# Patient Record
Sex: Female | Born: 1995 | Hispanic: Refuse to answer | Marital: Single | Smoking: Never smoker
Health system: Southern US, Community
[De-identification: ages and names within clinical notes are randomized; demographics above are authoritative.]

---

## 2016-02-05 ENCOUNTER — Ambulatory Visit (INDEPENDENT_AMBULATORY_CARE_PROVIDER_SITE_OTHER): Payer: Managed Care, Other (non HMO) | Admitting: Family Medicine

## 2016-02-05 ENCOUNTER — Ambulatory Visit
Admission: RE | Admit: 2016-02-05 | Discharge: 2016-02-05 | Disposition: A | Discharge status: Home / Self Care | Payer: Managed Care, Other (non HMO) | Source: Ambulatory Visit | Attending: Family Medicine | Admitting: Family Medicine

## 2016-02-05 ENCOUNTER — Encounter: Payer: Self-pay | Admitting: Family Medicine

## 2016-02-05 DIAGNOSIS — M545 Low back pain, unspecified: Secondary | ICD-10-CM

## 2016-02-05 MED ORDER — NAPROXEN 500 MG PO TABS: 500.0000 mg | ORAL_TABLET | Freq: Two times a day (BID) | ORAL | 0 refills | Status: AC

## 2016-02-05 MED ORDER — CYCLOBENZAPRINE HCL 5 MG PO TABS: 5.0000 mg | ORAL_TABLET | Freq: Two times a day (BID) | ORAL | 0 refills | Status: AC

## 2016-02-09 ENCOUNTER — Other Ambulatory Visit: Payer: Self-pay | Admitting: Family Medicine

## 2016-02-09 DIAGNOSIS — M545 Low back pain, unspecified: Secondary | ICD-10-CM

## 2016-02-12 ENCOUNTER — Ambulatory Visit (HOSPITAL_COMMUNITY)
Admission: RE | Admit: 2016-02-12 | Discharge: 2016-02-12 | Disposition: A | Discharge status: Home / Self Care | Payer: Managed Care, Other (non HMO) | Source: Ambulatory Visit | Attending: Family Medicine | Admitting: Family Medicine

## 2016-02-12 DIAGNOSIS — M5127 Other intervertebral disc displacement, lumbosacral region: Secondary | ICD-10-CM | POA: Diagnosis not present

## 2016-02-12 DIAGNOSIS — M545 Low back pain, unspecified: Secondary | ICD-10-CM

## 2016-02-14 ENCOUNTER — Ambulatory Visit: Payer: Managed Care, Other (non HMO)

## 2016-03-27 NOTE — Progress Notes (Signed)
Patient presents today with symptoms of lower back pain for the last few months. Patient denies any injury or trauma. Patient denies any radicular symptoms, incontinence, fever/chills, weight loss. She states that flexion and extension causes her symptoms. She has taken some anti-inflammatory medication at times for discomfort. She denies any recent imaging of her back.  ROS: Negative except mentioned above. Vitals as per Epic. GENERAL: NAD HEENT: no pharyngeal erythema, no exudate RESP: CTA B CARD: RRR MSK: No midline tenderness of back, mild generalized lower lumbar tenderness bilaterally, full range of motion, negative straight leg raise, normal toe and heel walk, normal gait, NV intact NEURO: CN II-XII grossly intact   A/P: Lower back pain without radicular symptoms - given the length of time of her symptoms will do some imaging, NSAIDs when necessary, muscle relaxant when necessary, seek medical attention if symptoms persist or worsen as discussed, modify activity as needed, work on cor strengthening and hamstring flexibility. Discussed with trainer.

## 2016-04-01 ENCOUNTER — Ambulatory Visit (INDEPENDENT_AMBULATORY_CARE_PROVIDER_SITE_OTHER): Payer: Managed Care, Other (non HMO) | Admitting: Family Medicine

## 2016-04-01 VITALS — BP 117/74 | HR 66 | Temp 98.3°F

## 2016-04-01 DIAGNOSIS — B349 Viral infection, unspecified: Secondary | ICD-10-CM

## 2016-04-01 NOTE — Progress Notes (Signed)
Patient presents today with symptoms of nasal drainage, cough, mild sore throat, night sweats, subjective fever. Patient states that she's had symptoms for the last day. She denies any chest pain, shortness of breath, abdominal pain, nausea, vomiting, diarrhea. She denies any history of mono in the past.  ROS: Negative except mentioned above.  GENERAL: NAD HEENT: mild pharyngeal erythema, no exudate, no erythema of TMs, mild to moderate cervical LAD RESP: CTA B CARD: RRR ABD: Nontender, no organomegaly appreciated NEURO: CN II-XII grossly intact   A/P: Viral Illness - will treat with Tylenol/Motrin when necessary, Claritin when necessary, Delsym when necessary, rest, hydration, if symptoms worsen would do labs such as Monospot test. No athletic activity today. Seek medical attention if symptoms persist or worsen as discussed. Discussed plan with trainer.

## 2016-04-21 ENCOUNTER — Ambulatory Visit (INDEPENDENT_AMBULATORY_CARE_PROVIDER_SITE_OTHER): Payer: Managed Care, Other (non HMO) | Admitting: Family Medicine

## 2016-04-21 DIAGNOSIS — G8929 Other chronic pain: Secondary | ICD-10-CM

## 2016-04-21 DIAGNOSIS — M545 Low back pain, unspecified: Secondary | ICD-10-CM

## 2016-04-21 NOTE — Progress Notes (Signed)
Patient presents today with persistent symptoms of lower back pain. Patient had an MRI back in February 2018 which showed a small disc protrusion at L5-S1. Patient has been able to modify softball and weight room activity and has been able to participate. She has been working on core strengthening and hamstring flexibility. She denies any incontinence, fever, radicular symptoms. Most of her symptoms are with flexion-type activities. She has taken naproxen and Flexeril in the past for her symptoms. She has not taken any steroids in the past. No new injury or trauma.  ROS: Negative except mentioned above.  GENERAL: NAD RESP: CTA B CARD: RRR MSK: Back - mild midline tenderness in the L5-S1 area, mild paravertebral tenderness in this area bilaterally, increased discomfort with flexion, normal extension, good hamstring flexibility, negative straight leg raise, normal heel and toe walk, NV intact NEURO: CN II-XII grossly intact   A/P: Lower back pain without radicular symptoms - reviewed previous MRI, would suggest trying a 12 day course of tapered steroids, Flexeril when necessary, continue to modify activity. If symptoms do persist or worsen do recommend that patient see Dr. Ardine Eng or Dr. Charisse March for further evaluation and treatment. Patient addresses understanding of plan and will seek medical attention if symptoms do persist or worsen as discussed.

## 2018-09-13 IMAGING — MR MR LUMBAR SPINE W/O CM
4 of 5 series · 19 of 48 positions shown · non-contrast
Comparison: Lumbar spine radiograph 02/05/2016

CLINICAL DATA: Bilateral low back pain for 3 months

EXAM:
MRI LUMBAR SPINE WITHOUT CONTRAST
TECHNIQUE: Multiplanar, multisequence MR imaging of the lumbar spine was
performed. No intravenous contrast was administered.

[Series 3: T1 · sagittal · 4.0mm · 0.51mm/px · 3 of 12 slices shown (1 of 2)]
[im 3/12]
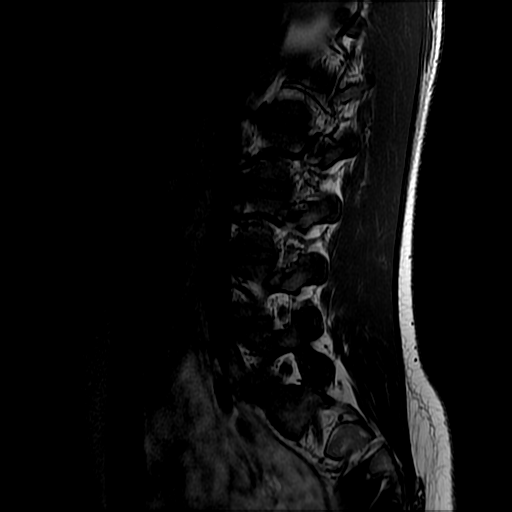
[im 7/12]
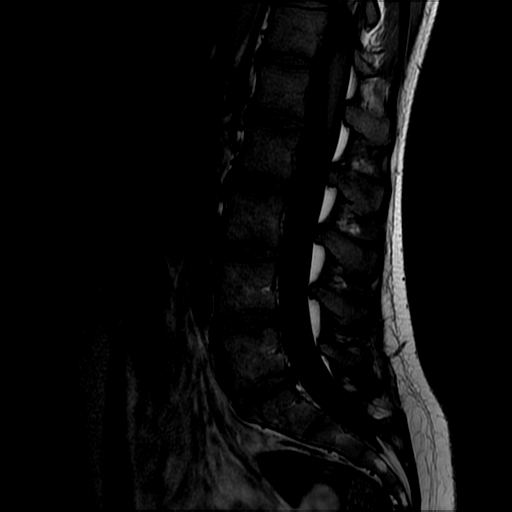
[im 12/12]
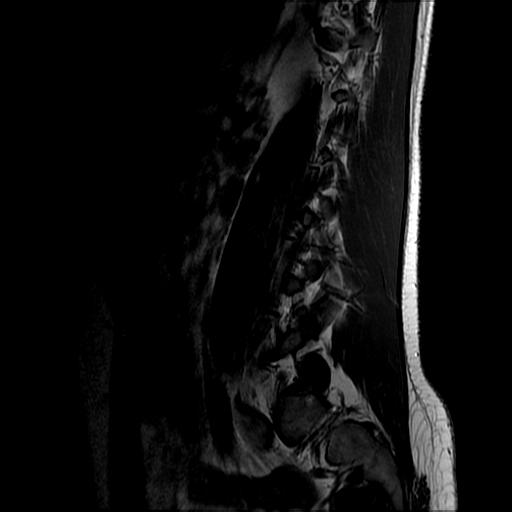

[Series 4: T2 post-contrast · sagittal · 4.0mm · 0.51mm/px · 5 of 12 slices shown]
[im 1/12]
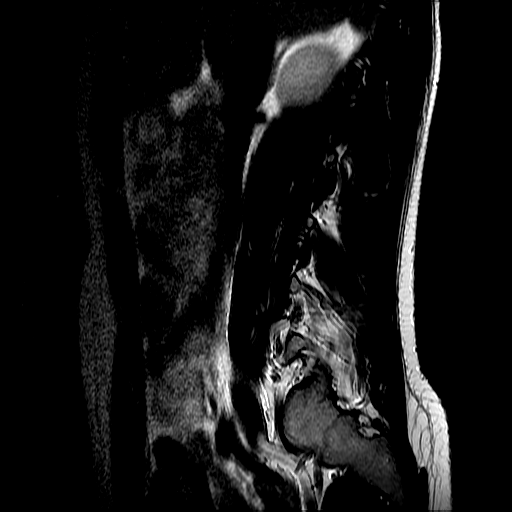
[im 3/12]
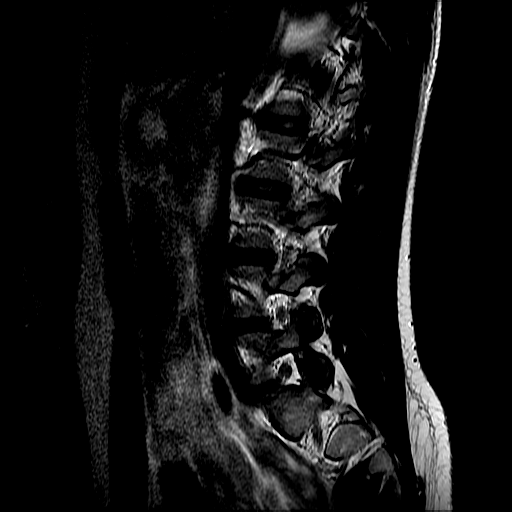
[im 6/12]
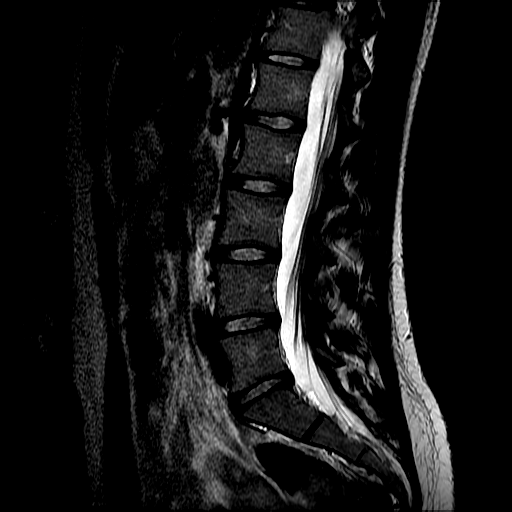
[im 9/12]
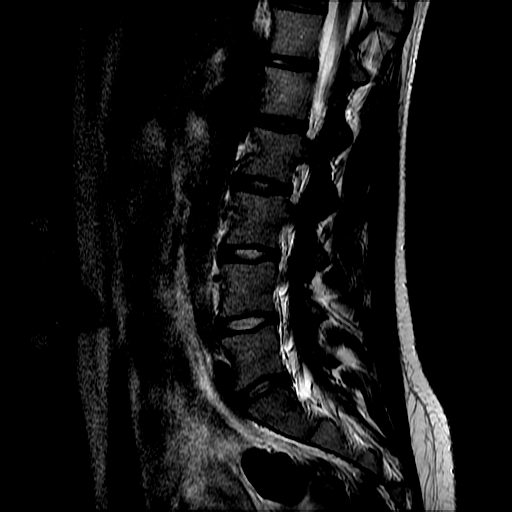
[im 12/12]
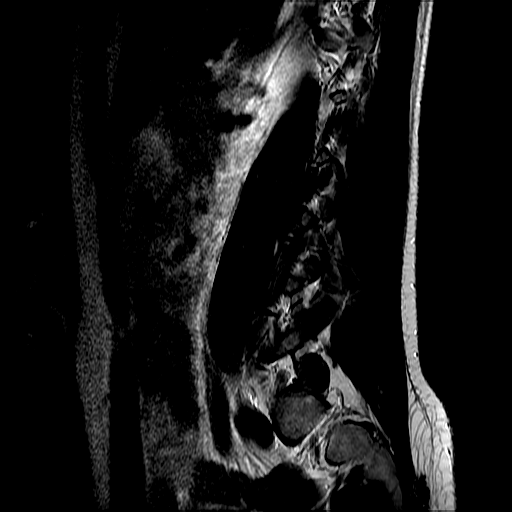

[Series 6: T2 · axial · 4.0mm · 0.39mm/px · z∈[-54,+121]mm · 8 of 36 slices shown]
[im 3/36]
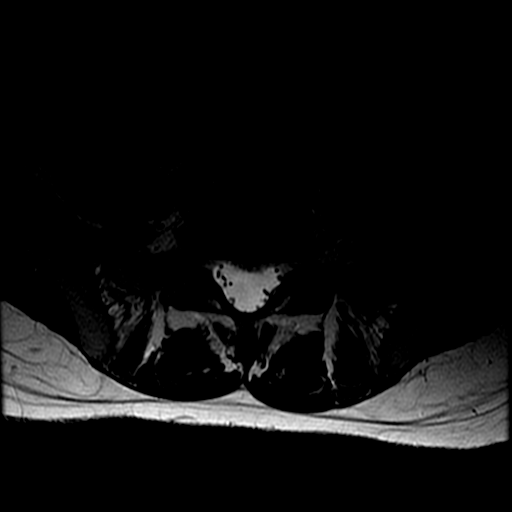
[im 5/36]
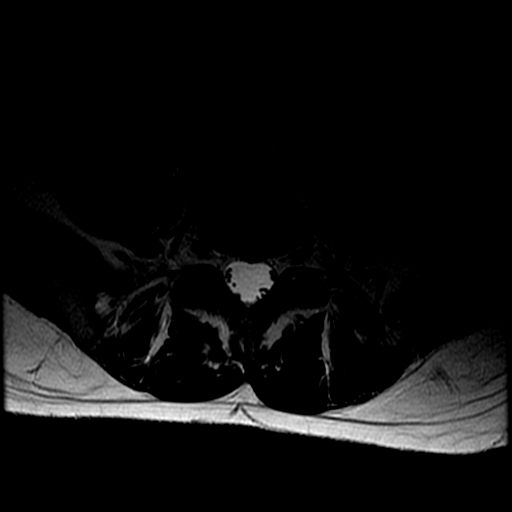
[im 8/36]
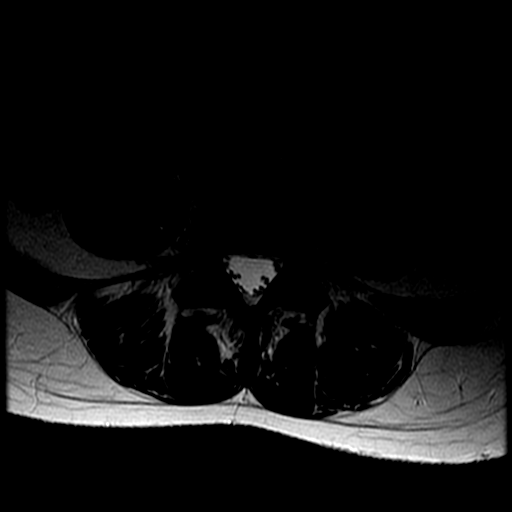
[im 12/36]
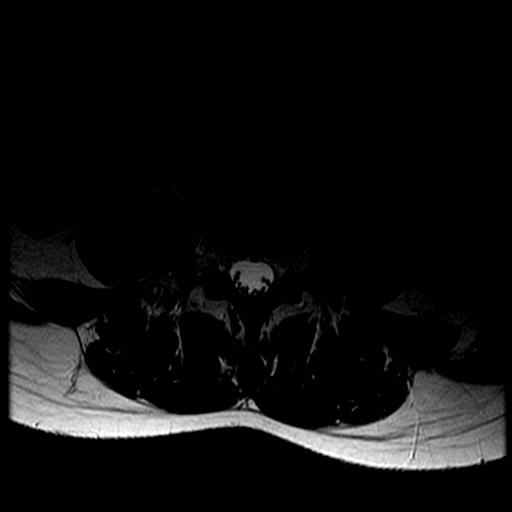
[im 17/36]
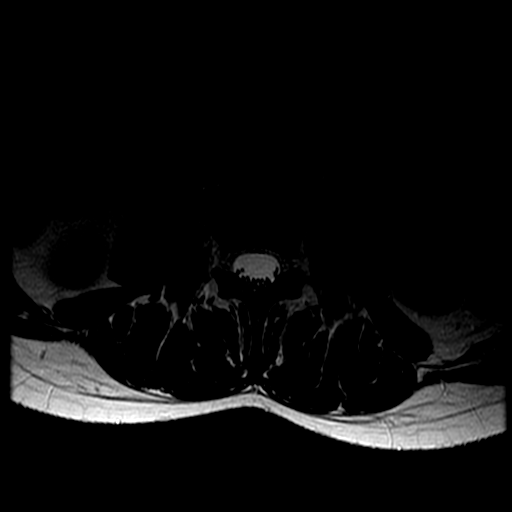
[im 19/36]
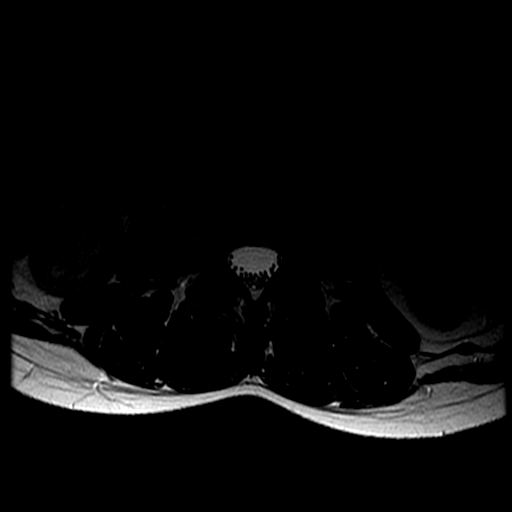
[im 22/36]
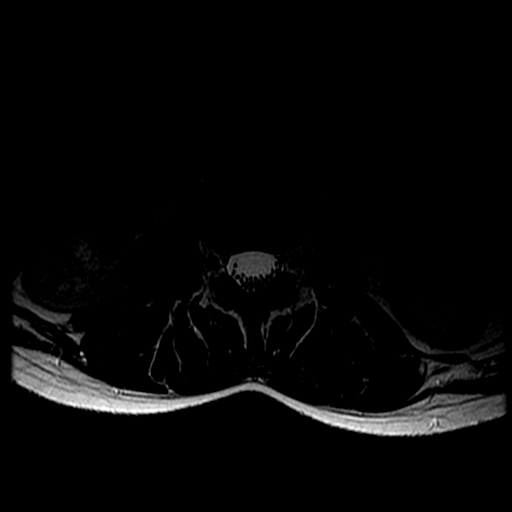
[im 31/36]
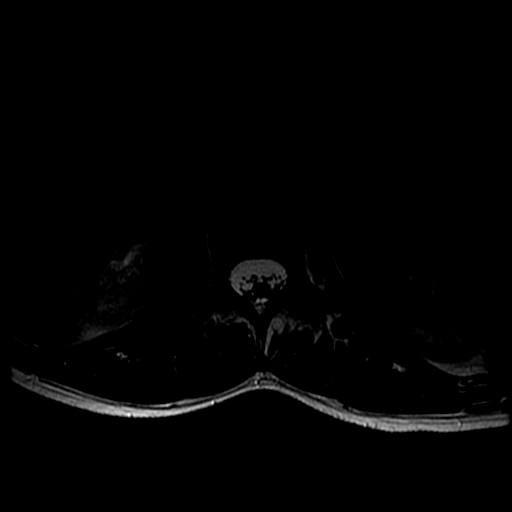

[Series 7: T1 · axial · 4.0mm · 0.39mm/px · z∈[-44,+121]mm · 3 of 36 slices shown (2 of 2)]
[im 5/36]
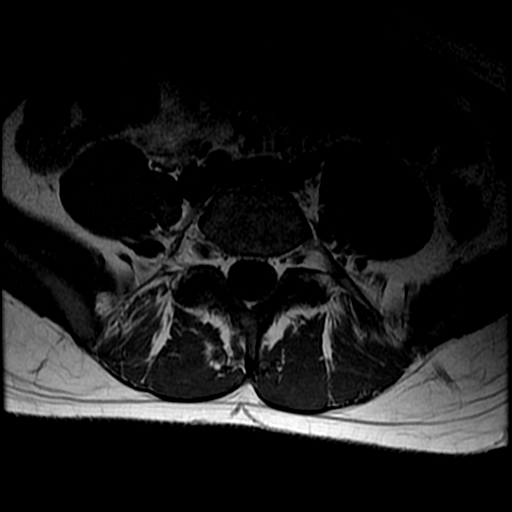
[im 19/36]
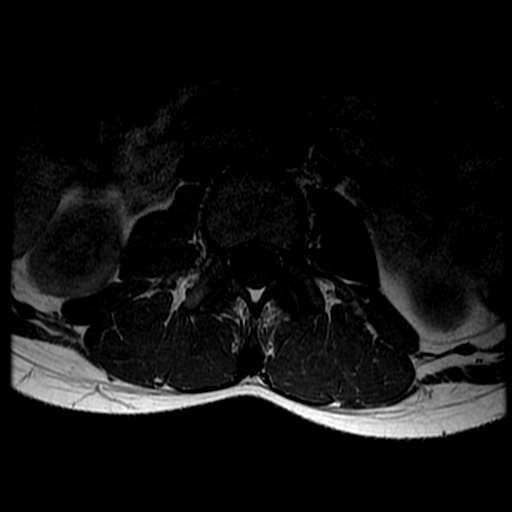
[im 31/36]
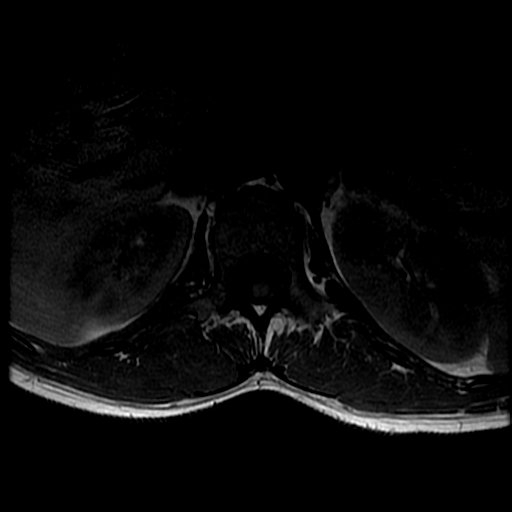

[19 of 48 positions shown; findings below may reference images not displayed]

FINDINGS: Segmentation: Normal. The lowest disc space is considered to be
L5-S1.

Alignment:  Normal

Vertebrae: No acute compression fracture, discitis-osteomyelitis,
facet edema or other focal marrow lesion. No epidural collection.

Conus medullaris: Extends to the L1-L2 disc level and appears
normal.

Paraspinal and other soft tissues: The visualized aorta, IVC and
iliac vessels are normal. The visualized retroperitoneal organs and
paraspinal soft tissues are normal.

Disc levels:

T12-L1: Normal disc space and facets. No spinal canal or
neuroforaminal stenosis.

L1-L2: Normal disc space and facets. No spinal canal or
neuroforaminal stenosis.

L2-L3: Normal disc space and facets. No spinal canal or
neuroforaminal stenosis.

L3-L4: Normal disc space and facets. No spinal canal or
neuroforaminal stenosis.

L4-L5: Normal disc space and facets. No spinal canal or
neuroforaminal stenosis.

L5-S1: Minimal central disc protrusion without spinal canal, lateral
recess or neural foraminal stenosis. Normal facets.
IMPRESSION: Small L5-S1 central disc protrusion without associated stenosis.
Otherwise normal lumbar spine MRI.

Comment: The following findings are so common in people without low
back pain that while we report their presence, they must be
interpreted with caution and in the context of the clinical
situation. (Reference - Jarvik et al, Spine [id number])
FINDINGS: Prevalence in patients without low back pain -

--Disc degeneration (91%)

--Disc T2-signal loss (83%)

--Disc height loss (56%)

--Disc bulge (64%)

--Disc protrusion (32%)

--Annular fissure (38%)
